# Patient Record
Sex: Male | Born: 1985 | Race: White | Hispanic: No | Smoking: Current every day smoker
Health system: Southern US, Community
[De-identification: ages and names within clinical notes are randomized; demographics above are authoritative.]

## PROBLEM LIST (undated history)

## (undated) DIAGNOSIS — Z21 Asymptomatic human immunodeficiency virus [HIV] infection status: Secondary | ICD-10-CM

## (undated) HISTORY — PX: HEMORROIDECTOMY: SUR656

## (undated) HISTORY — PX: TONSILLECTOMY: SUR1361

---

## 2008-09-28 ENCOUNTER — Emergency Department (HOSPITAL_COMMUNITY): Admission: EM | Admit: 2008-09-28 | Discharge: 2008-09-28 | Payer: Self-pay | Admitting: Family Medicine

## 2008-11-25 ENCOUNTER — Emergency Department (HOSPITAL_COMMUNITY): Admission: EM | Admit: 2008-11-25 | Discharge: 2008-11-25 | Payer: Self-pay | Admitting: Emergency Medicine

## 2010-04-25 LAB — POCT RAPID STREP A (OFFICE): Streptococcus, Group A Screen (Direct): NEGATIVE

## 2012-12-07 DIAGNOSIS — Z0289 Encounter for other administrative examinations: Secondary | ICD-10-CM | POA: Insufficient documentation

## 2013-10-02 ENCOUNTER — Emergency Department (HOSPITAL_COMMUNITY): Payer: Non-veteran care

## 2013-10-02 ENCOUNTER — Emergency Department (HOSPITAL_COMMUNITY)
Admission: EM | Admit: 2013-10-02 | Discharge: 2013-10-03 | Disposition: A | Discharge status: Other Acute Inpatient Hospital | Payer: Non-veteran care | Attending: Emergency Medicine | Admitting: Emergency Medicine

## 2013-10-02 ENCOUNTER — Encounter (HOSPITAL_COMMUNITY): Payer: Self-pay | Admitting: Emergency Medicine

## 2013-10-02 DIAGNOSIS — R000 Tachycardia, unspecified: Secondary | ICD-10-CM | POA: Insufficient documentation

## 2013-10-02 DIAGNOSIS — K610 Anal abscess: Secondary | ICD-10-CM

## 2013-10-02 DIAGNOSIS — R112 Nausea with vomiting, unspecified: Secondary | ICD-10-CM | POA: Insufficient documentation

## 2013-10-02 DIAGNOSIS — Z9089 Acquired absence of other organs: Secondary | ICD-10-CM | POA: Insufficient documentation

## 2013-10-02 DIAGNOSIS — Z79899 Other long term (current) drug therapy: Secondary | ICD-10-CM | POA: Insufficient documentation

## 2013-10-02 DIAGNOSIS — R1032 Left lower quadrant pain: Secondary | ICD-10-CM | POA: Insufficient documentation

## 2013-10-02 DIAGNOSIS — K625 Hemorrhage of anus and rectum: Secondary | ICD-10-CM | POA: Diagnosis present

## 2013-10-02 DIAGNOSIS — F17200 Nicotine dependence, unspecified, uncomplicated: Secondary | ICD-10-CM | POA: Diagnosis not present

## 2013-10-02 DIAGNOSIS — K612 Anorectal abscess: Secondary | ICD-10-CM | POA: Insufficient documentation

## 2013-10-02 LAB — BASIC METABOLIC PANEL
ANION GAP: 13 (ref 5–15)
BUN: 10 mg/dL (ref 6–23)
CALCIUM: 9.2 mg/dL (ref 8.4–10.5)
CO2: 27 meq/L (ref 19–32)
CREATININE: 0.61 mg/dL (ref 0.50–1.35)
Chloride: 94 mEq/L — ABNORMAL LOW (ref 96–112)
GFR calc Af Amer: >90 mL/min (ref >90)
GFR calc non Af Amer: >90 mL/min (ref >90)
Glucose, Bld: 62 mg/dL — ABNORMAL LOW (ref 70–99)
Potassium: 4.4 mEq/L (ref 3.7–5.3)
Sodium: 134 mEq/L — ABNORMAL LOW (ref 137–147)

## 2013-10-02 LAB — CBC WITH DIFFERENTIAL/PLATELET
Basophils Absolute: 0 10*3/uL (ref 0.0–0.1)
Basophils Relative: 0 % (ref 0–1)
EOS PCT: 1 % (ref 0–5)
Eosinophils Absolute: 0.2 10*3/uL (ref 0.0–0.7)
HCT: 45.1 % (ref 39.0–52.0)
Hemoglobin: 15.8 g/dL (ref 13.0–17.0)
LYMPHS ABS: 1.5 10*3/uL (ref 0.7–4.0)
Lymphocytes Relative: 7 % — ABNORMAL LOW (ref 12–46)
MCH: 31.2 pg (ref 26.0–34.0)
MCHC: 35 g/dL (ref 30.0–36.0)
MCV: 89.1 fL (ref 78.0–100.0)
MONO ABS: 2 10*3/uL — AB (ref 0.1–1.0)
Monocytes Relative: 9 % (ref 3–12)
NEUTROS ABS: 18.1 10*3/uL — AB (ref 1.7–7.7)
Neutrophils Relative %: 83 % — ABNORMAL HIGH (ref 43–77)
PLATELETS: 308 10*3/uL (ref 150–400)
RBC: 5.06 MIL/uL (ref 4.22–5.81)
RDW: 13.1 % (ref 11.5–15.5)
WBC: 21.8 10*3/uL — ABNORMAL HIGH (ref 4.0–10.5)

## 2013-10-02 LAB — LACTIC ACID, PLASMA: Lactic Acid, Venous: 1.7 mmol/L (ref 0.5–2.2)

## 2013-10-02 LAB — CBG MONITORING, ED
Glucose-Capillary: 105 mg/dL — ABNORMAL HIGH (ref 70–99)
Glucose-Capillary: 101 mg/dL — ABNORMAL HIGH (ref 70–99)

## 2013-10-02 MED ORDER — ONDANSETRON HCL 4 MG/2ML IJ SOLN
4.0000 mg | Freq: Once | INTRAMUSCULAR | Status: AC
  Administered 2013-10-02: 4 mg via INTRAVENOUS
  Filled 2013-10-02: qty 2

## 2013-10-02 MED ORDER — IOHEXOL 300 MG/ML  SOLN
25.0000 mL | INTRAMUSCULAR | Status: DC | PRN
  Administered 2013-10-02: 25 mL via ORAL

## 2013-10-02 MED ORDER — HYDROMORPHONE HCL PF 1 MG/ML IJ SOLN
1.0000 mg | Freq: Once | INTRAMUSCULAR | Status: AC
  Administered 2013-10-02: 1 mg via INTRAVENOUS
  Filled 2013-10-02: qty 1

## 2013-10-02 MED ORDER — GLUCOSE 40 % PO GEL
1.0000 | Freq: Once | ORAL | Status: AC
  Administered 2013-10-02: 37.5 g via ORAL
  Filled 2013-10-02: qty 1

## 2013-10-02 MED ORDER — ACETAMINOPHEN 325 MG PO TABS
650.0000 mg | ORAL_TABLET | Freq: Once | ORAL | Status: AC
  Administered 2013-10-02: 650 mg via ORAL
  Filled 2013-10-02: qty 2

## 2013-10-02 MED ORDER — SODIUM CHLORIDE 0.9 % IV BOLUS (SEPSIS)
1000.0000 mL | Freq: Once | INTRAVENOUS | Status: AC
  Administered 2013-10-02: 1000 mL via INTRAVENOUS

## 2013-10-02 MED ORDER — SODIUM CHLORIDE 0.9 % IV SOLN
INTRAVENOUS | Status: DC
  Administered 2013-10-02: 125 mL/h via INTRAVENOUS

## 2013-10-02 MED ORDER — CLINDAMYCIN PHOSPHATE 600 MG/50ML IV SOLN
600.0000 mg | Freq: Once | INTRAVENOUS | Status: AC
  Administered 2013-10-02: 600 mg via INTRAVENOUS
  Filled 2013-10-02: qty 50

## 2013-10-02 MED ORDER — ONDANSETRON HCL 4 MG/2ML IJ SOLN
4.0000 mg | Freq: Once | INTRAMUSCULAR | Status: AC
Start: 2013-10-02 — End: 2013-10-02
  Administered 2013-10-02: 4 mg via INTRAVENOUS
  Filled 2013-10-02: qty 2

## 2013-10-02 MED ORDER — IOHEXOL 300 MG/ML  SOLN
80.0000 mL | Freq: Once | INTRAMUSCULAR | Status: AC | PRN
  Administered 2013-10-02: 80 mL via INTRAVENOUS

## 2013-10-02 NOTE — ED Notes (Signed)
Tiffany, PA-C at the bedside.

## 2013-10-02 NOTE — ED Notes (Signed)
cbg is 105

## 2013-10-02 NOTE — ED Notes (Signed)
Tiffany, PA-C, gives verbal order for  zofran IV

## 2013-10-02 NOTE — ED Notes (Signed)
Spoke with Elmarie Shiley, PA-C with patient status.  Temp is 100.0.  She acknowledges, no new orders.  Considering surgery, patient is allowed ice chips at this time.

## 2013-10-02 NOTE — ED Notes (Signed)
Monica, Diplomatic Services operational officer has called for transport.

## 2013-10-02 NOTE — ED Provider Notes (Signed)
CSN: [id number]     Arrival date & time 10/02/13  1212 History   First MD Initiated Contact with Patient 10/02/13 1508     Chief Complaint  Patient presents with  . Rectal Bleeding  . Pain     (Consider location/radiation/quality/duration/timing/severity/associated sxs/prior Treatment) HPI  [facility] Leana Roe, DO)  Patient to the ER with complaints of rectal pain, swelling. As well as fever, nausea and vomiting since this morning. He had a hemmroidectomy done on September 3 (10 days ago) and approx 5 days ago started to develop increased swelling and  increased pain. As of yesterday he has started having too much pain to pass a bowel movement. He has also noted drainage and a foul smell.  Denies sticking anything into rectum. This morning he feels sick, has nausea and vomiting as well as fever of 101. [facility] and they told him to go the local ER for evaluation.   History reviewed. No pertinent past medical history. Past Surgical History  Procedure Laterality Date  . Tonsillectomy    . Hemorroidectomy     No family history on file. History  Substance Use Topics  . Smoking status: Current Every Day Smoker  . Smokeless tobacco: Not on file  . Alcohol Use: No    Review of Systems  Gen: no weight loss, fevers, chills, night sweats  Eyes: no occular draining, occular pain,  No visual changes  Nose: no epistaxis or rhinorrhea  Mouth: no dental pain, no sore throat  Neck: no neck pain  Lungs: No hemoptysis. No wheezing or coughing CV:  No palpitations, dependent edema or orthopnea. No chest pain Abd: no diarrhea. No nausea or vomiting, + abdominal pain and rectal pain GU: no dysuria or gross hematuria  MSK:  No muscle weakness, No muscular pain Neuro: no headache, no focal neurologic deficits  Skin: no rash , no wounds Psyche: no complaints of depression or anxiety    Allergies  Review of patient's allergies indicates  no known allergies.  Home Medications   Prior to Admission medications   Medication Sig Start Date End Date Taking? Authorizing Provider  gabapentin (NEURONTIN) 100 MG capsule Take 200 mg by mouth 3 (three) times daily as needed (anxiety).   Yes Historical Provider, MD  HYDROmorphone (DILAUDID) 4 MG tablet Take 4 mg by mouth every 4 (four) hours as needed for severe pain.   Yes Historical Provider, MD  ibuprofen (ADVIL,MOTRIN) 800 MG tablet Take 800 mg by mouth every 8 (eight) hours as needed (pain).   Yes Historical Provider, MD  ondansetron (ZOFRAN) 8 MG tablet Take 4 mg by mouth every 8 (eight) hours as needed for nausea or vomiting.   Yes Historical Provider, MD  oxyCODONE-acetaminophen (PERCOCET/ROXICET) 5-325 MG per tablet Take 1-2 tablets by mouth every 6 (six) hours as needed for severe pain.   Yes Historical Provider, MD   BP 110/74  Pulse 60  Temp(Src) 98.9 F (37.2 C) (Oral)  Resp 20  SpO2 95% Physical Exam  Nursing note and vitals reviewed. Constitutional: He appears well-developed and well-nourished. No distress.  HENT:  Head: Normocephalic and atraumatic.  Eyes: Pupils are equal, round, and reactive to light.  Neck: Normal range of motion. Neck supple.  Cardiovascular: Regular rhythm.  Tachycardia present.   Pulmonary/Chest: Effort normal.  Abdominal: Soft. Bowel sounds are normal. He exhibits no distension and no fluid wave. There is tenderness (suprapubic and LLQ pain). There  is no rebound, no guarding and no CVA tenderness.  Genitourinary: Rectal exam shows tenderness.  Pt has induration, purulent foul drainage and severe tenderness. Unable to tolerate digital rectal exam due to severe pain. No bleeding.  Neurological: He is alert.  Skin: Skin is warm and dry.    ED Course  Procedures (including critical care time) Labs Review Labs Reviewed  CBC WITH DIFFERENTIAL - Abnormal; Notable for the following:    WBC 21.8 (*)    Neutrophils Relative % 83 (*)     Lymphocytes Relative 7 (*)    Neutro Abs 18.1 (*)    Monocytes Absolute 2.0 (*)    All other components within normal limits  BASIC METABOLIC PANEL - Abnormal; Notable for the following:    Sodium 134 (*)    Chloride 94 (*)    Glucose, Bld 62 (*)    All other components within normal limits  CBG MONITORING, ED - Abnormal; Notable for the following:    Glucose-Capillary 105 (*)    All other components within normal limits  CBG MONITORING, ED - Abnormal; Notable for the following:    Glucose-Capillary 101 (*)    All other components within normal limits  CULTURE, BLOOD (ROUTINE X 2)  CULTURE, BLOOD (ROUTINE X 2)  LACTIC ACID, PLASMA    Imaging Review Ct Abdomen Pelvis W Contrast  10/02/2013   CLINICAL DATA:  Recent hemorrhoid surgery.  Pain.  EXAM: CT ABDOMEN AND PELVIS WITH CONTRAST  TECHNIQUE: Multidetector CT imaging of the abdomen and pelvis was performed using the standard protocol following bolus administration of intravenous contrast.  CONTRAST:  80mL OMNIPAQUE IOHEXOL 300 MG/ML  SOLN  COMPARISON:  None.  FINDINGS: The lung bases are clear.  No pleural or pericardial effusion.  There is no suspicious liver abnormality. The gallbladder appears normal. No biliary dilatation. Normal appearance of the pancreas. The spleen is unremarkable.  The adrenal glands are both normal. The kidneys are both negative. Urinary bladder appears normal. Prostate gland and seminal vesicles are unremarkable.  Normal caliber of the abdominal aorta. There is no aneurysm. No retroperitoneal adenopathy. No small bowel mesenteric adenopathy. No pelvic or inguinal adenopathy identified.  The stomach is normal. The small bowel loops have a normal course and caliber. No obstruction. The appendix is visualized and appears normal. Normal appearance of the colon.  Within the left gluteal fold there is a perirectal/anal fluid collection measuring 2.9 x 2.2 x 4.0 cm, image 86 of series 301 and image 71 of series 303.  Review  of the visualized osseous structures is on unremarkable.  IMPRESSION: 1. Examination is positive for perianal/rectal fluid collection within the left gluteal fold. Differential considerations include abscess, hematoma, or postoperative seroma. This measures 2.9 x 2.2 x 4.0 cm and likely accounts for the patient's worsening pain and palpable abnormality.   Electronically Signed   By: Signa Kell M.D.   On: 10/02/2013 18:52     EKG Interpretation None      MDM   Final diagnoses:  Perianal abscess   3:36 pm Pt has abdominal pain, recent history, foul smelling puss coming from rectum. Unable to tolerate rectal exam to feel for fluctuance. He does not appear sick but is tachycardic and reports fever at home. Will get labs and CT scan. Will also give fluids and pain medications.  8:43 pm Patient with fever, elevated WBC, low glucose. He was given oral glucagon and fluids. PO Tylenol for low grade fever. Negative lactate. CT scan shows  large perirectal abscess. I spoke with Dr. Magnus Ivan who said pt needs to go back to the [facility] for this. I spoke with [facility] who says that I need to fill out a transfer request form, there hospitalist will review this and accept or decline patient.  10:15 pm [facility] with [facility] who did not realize this is a post surgical complication. He will contact the General Surgeon for me and will have the surgeon call me.   10: 33 pm Dr/ Claudette Laws,, the [facility] and said that they accept the patient. He needs to call the hospital administrator to find out how to transfer patient over the [place]. Pt Accepted and ready for transfer.  Filed Vitals:   10/02/13 2215  BP: 110/74  Pulse: 60  Temp:   Resp:     11:42 pm Patient seen by Dr. Littie Deeds just prior to transfer. No concerns, agrees to plan.   Dorthula Matas, PA-C 10/02/13 2337  Dorthula Matas, PA-C 10/02/13 2343

## 2013-10-02 NOTE — ED Notes (Signed)
[facility] [place] per discussion with Tiffany, PA-C.

## 2013-10-02 NOTE — ED Notes (Signed)
Pt. Stated, I had hemmoroid surgery and the pain is worse and I fell like there's a lump down there.  Called the [facility] hospital and they told me to come here.

## 2013-10-03 DIAGNOSIS — R1032 Left lower quadrant pain: Secondary | ICD-10-CM | POA: Diagnosis not present

## 2013-10-03 DIAGNOSIS — F17200 Nicotine dependence, unspecified, uncomplicated: Secondary | ICD-10-CM | POA: Diagnosis not present

## 2013-10-03 DIAGNOSIS — R112 Nausea with vomiting, unspecified: Secondary | ICD-10-CM | POA: Diagnosis not present

## 2013-10-03 DIAGNOSIS — K612 Anorectal abscess: Secondary | ICD-10-CM | POA: Diagnosis not present

## 2013-10-03 DIAGNOSIS — Z9089 Acquired absence of other organs: Secondary | ICD-10-CM | POA: Diagnosis not present

## 2013-10-03 DIAGNOSIS — K625 Hemorrhage of anus and rectum: Secondary | ICD-10-CM | POA: Diagnosis present

## 2013-10-03 DIAGNOSIS — Z79899 Other long term (current) drug therapy: Secondary | ICD-10-CM | POA: Diagnosis not present

## 2013-10-03 DIAGNOSIS — R000 Tachycardia, unspecified: Secondary | ICD-10-CM | POA: Diagnosis not present

## 2013-10-03 MED ORDER — HYDROMORPHONE HCL PF 1 MG/ML IJ SOLN
1.0000 mg | Freq: Once | INTRAMUSCULAR | Status: AC
  Administered 2013-10-03: 1 mg via INTRAVENOUS
  Filled 2013-10-03: qty 1

## 2013-10-03 MED ORDER — PROMETHAZINE HCL 25 MG/ML IJ SOLN
12.5000 mg | Freq: Once | INTRAMUSCULAR | Status: AC
  Administered 2013-10-03: 12.5 mg via INTRAVENOUS
  Filled 2013-10-03: qty 1

## 2013-10-03 NOTE — ED Notes (Signed)
Visual merchandiser, Maxine Glenn, for ETA on transport.  No ETA, transport is behind.

## 2013-10-03 NOTE — ED Provider Notes (Signed)
Medical screening examination/treatment/procedure(s) were conducted as a shared visit with non-physician practitioner(s) and myself.  I personally evaluated the patient during the encounter.   EKG Interpretation None      Briefly, pt is a 28 y.o. male presenting with rectal bleeding and pus from rectum in setting of recent hemorrhoidectomy.  Discussed with surgery and pt transferred to [place].  I performed an examination on the patient including cardiac, pulmonary, and gi systems which were unremarkable.     Mirian Mo, MD 10/03/13 [phone]

## 2013-10-08 LAB — CULTURE, BLOOD (ROUTINE X 2)
CULTURE: NO GROWTH
Culture: NO GROWTH

## 2016-01-13 IMAGING — CT CT ABD-PELV W/ CM
2 of 5 series · 12 of 46 positions shown, 14 images · IV contrast (Iodine)
Comparison: None.

CLINICAL DATA: Recent hemorrhoid surgery.  Pain.

EXAM:
CT ABDOMEN AND PELVIS WITH CONTRAST
TECHNIQUE: Multidetector CT imaging of the abdomen and pelvis was performed
using the standard protocol following bolus administration of
intravenous contrast.
CONTRAST:  80mL OMNIPAQUE IOHEXOL 300 MG/ML  SOLN

[Series 301: routine, idose (2) · axial · 0.78mm/px · z∈[-487,-87]mm · 9 of 92 slices shown, 11 images]
[im 6/92  soft-tissue]
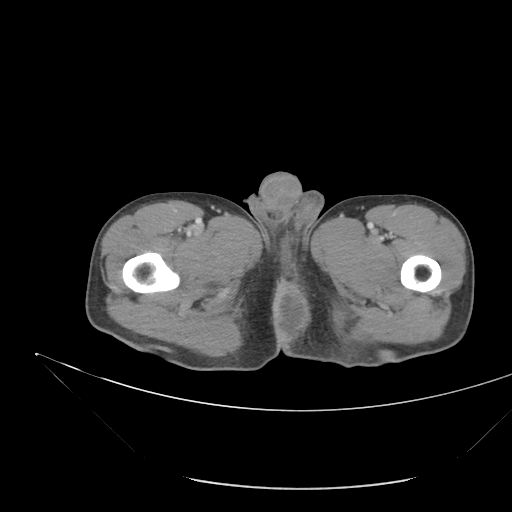
[im 6/92  bone]
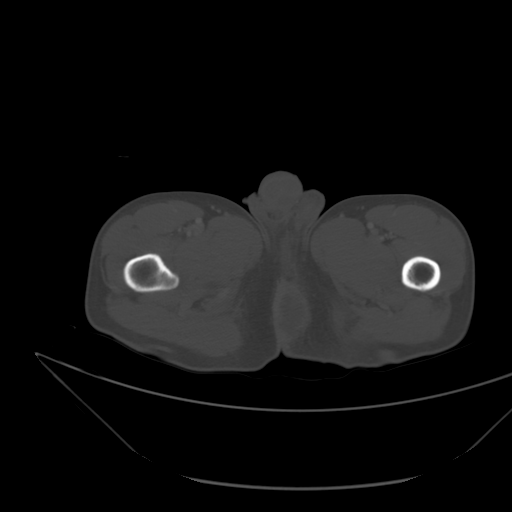
[im 18/92  soft-tissue]
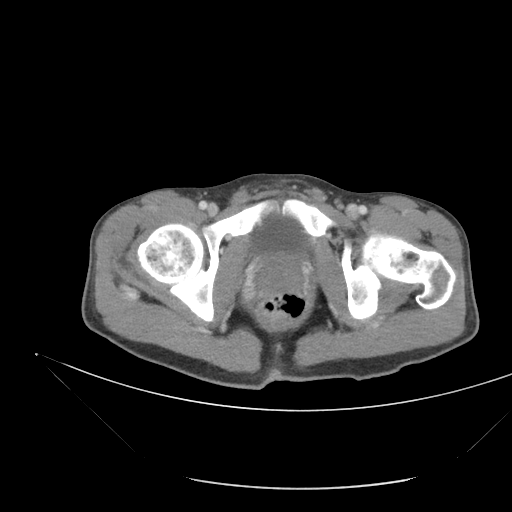
[im 27/92  soft-tissue]
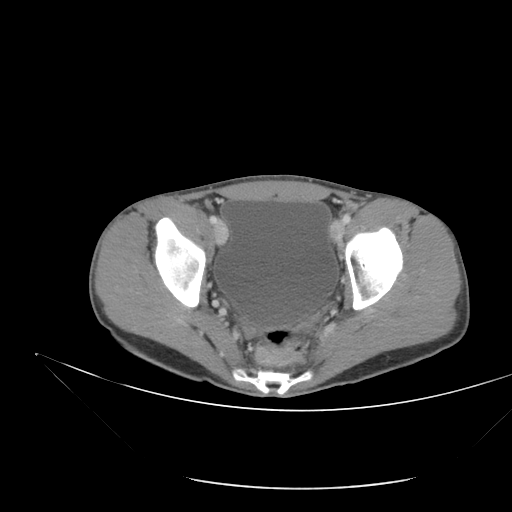
[im 36/92  soft-tissue]
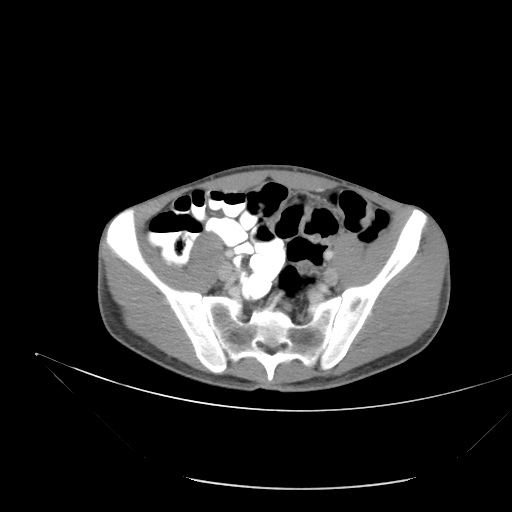
[im 47/92  soft-tissue]
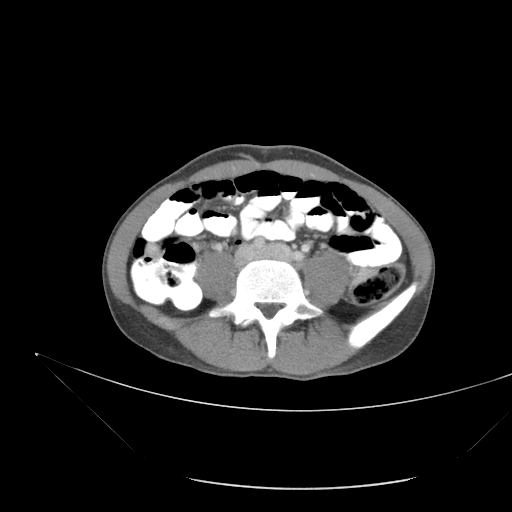
[im 56/92  soft-tissue]
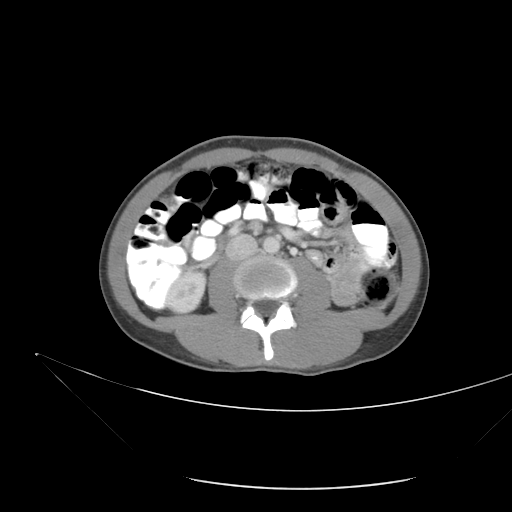
[im 65/92  soft-tissue]
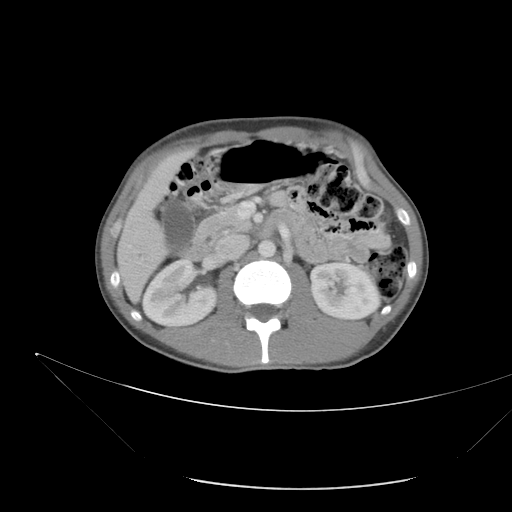
[im 77/92  soft-tissue]
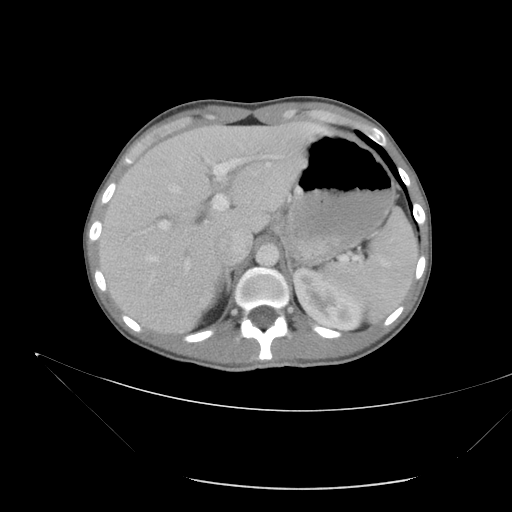
[im 86/92  soft-tissue]
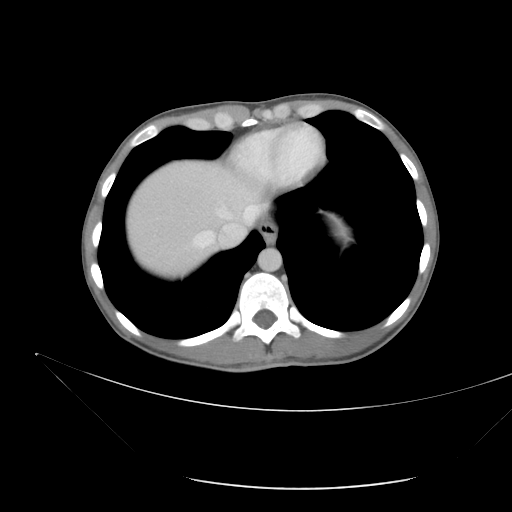
[im 86/92  bone]
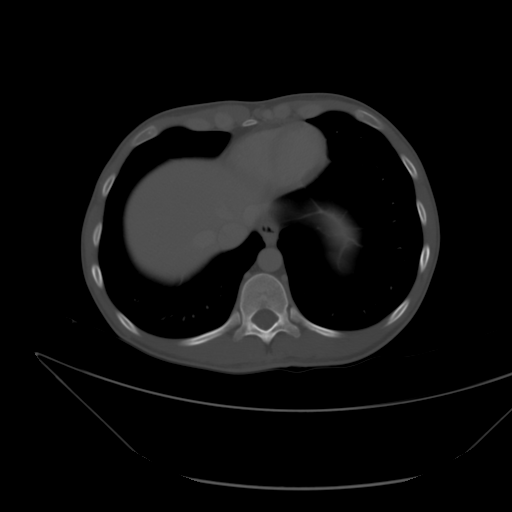

[Series 303: coronals, idose (2) · coronal · 0.50mm/px · 3 of 123 slices shown]
[im 46/123  soft-tissue]
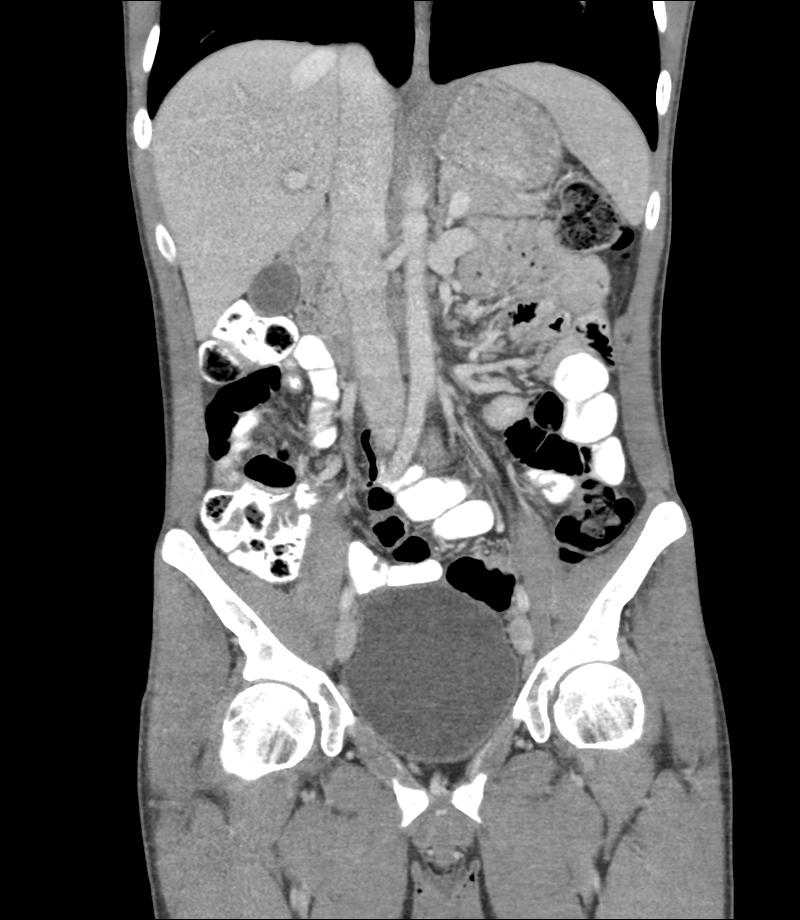
[im 62/123  soft-tissue]
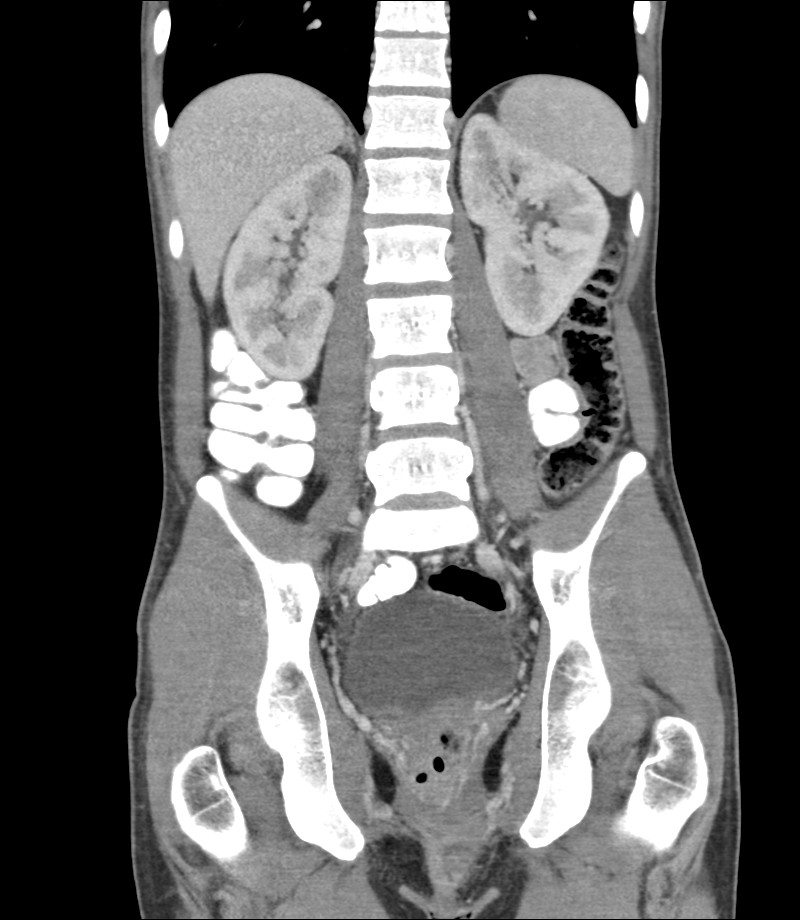
[im 77/123  soft-tissue]
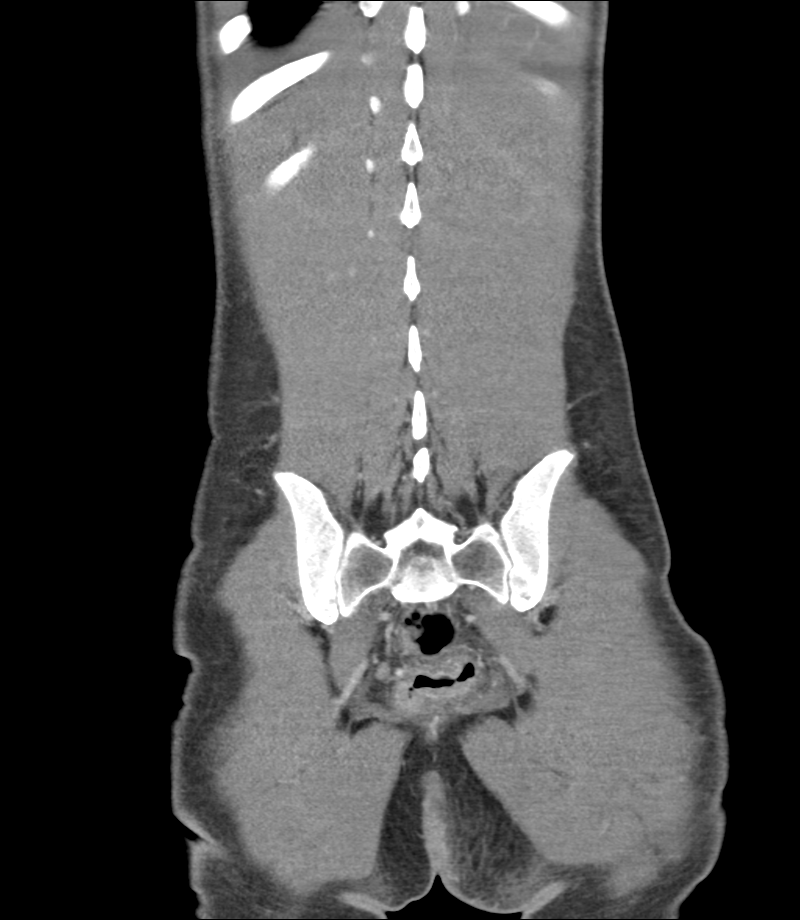

[12 of 46 positions shown; findings below may reference images not displayed]

FINDINGS: The lung bases are clear.  No pleural or pericardial effusion.

There is no suspicious liver abnormality. The gallbladder appears
normal. No biliary dilatation. Normal appearance of the pancreas.
The spleen is unremarkable.

The adrenal glands are both normal. The kidneys are both negative.
Urinary bladder appears normal. Prostate gland and seminal vesicles
are unremarkable.

Normal caliber of the abdominal aorta. There is no aneurysm. No
retroperitoneal adenopathy. No small bowel mesenteric adenopathy. No
pelvic or inguinal adenopathy identified.

The stomach is normal. The small bowel loops have a normal course
and caliber. No obstruction. The appendix is visualized and appears
normal. Normal appearance of the colon.

Within the left gluteal fold there is a perirectal/anal fluid
collection measuring 2.9 x 2.2 x 4.0 cm, image 86 of series 301 and
image 71 of series 303.

Review of the visualized osseous structures is on unremarkable.
IMPRESSION: 1. Examination is positive for perianal/rectal fluid collection
within the left gluteal fold. Differential considerations include
abscess, hematoma, or postoperative seroma. This measures 2.9 x
x 4.0 cm and likely accounts for the patient's worsening pain and
palpable abnormality.

## 2018-11-17 ENCOUNTER — Encounter (HOSPITAL_COMMUNITY): Payer: Self-pay

## 2018-11-17 ENCOUNTER — Ambulatory Visit (HOSPITAL_COMMUNITY)
Admission: EM | Admit: 2018-11-17 | Discharge: 2018-11-17 | Disposition: A | Discharge status: Home / Self Care | Payer: No Typology Code available for payment source | Attending: Emergency Medicine | Admitting: Emergency Medicine

## 2018-11-17 DIAGNOSIS — R0982 Postnasal drip: Secondary | ICD-10-CM

## 2018-11-17 DIAGNOSIS — M791 Myalgia, unspecified site: Secondary | ICD-10-CM

## 2018-11-17 DIAGNOSIS — Z20828 Contact with and (suspected) exposure to other viral communicable diseases: Secondary | ICD-10-CM | POA: Insufficient documentation

## 2018-11-17 DIAGNOSIS — R0981 Nasal congestion: Secondary | ICD-10-CM

## 2018-11-17 DIAGNOSIS — R6883 Chills (without fever): Secondary | ICD-10-CM

## 2018-11-17 DIAGNOSIS — B349 Viral infection, unspecified: Secondary | ICD-10-CM | POA: Insufficient documentation

## 2018-11-17 DIAGNOSIS — R05 Cough: Secondary | ICD-10-CM

## 2018-11-17 DIAGNOSIS — R519 Headache, unspecified: Secondary | ICD-10-CM

## 2018-11-17 DIAGNOSIS — R197 Diarrhea, unspecified: Secondary | ICD-10-CM

## 2018-11-17 DIAGNOSIS — J3489 Other specified disorders of nose and nasal sinuses: Secondary | ICD-10-CM

## 2018-11-17 DIAGNOSIS — Z20822 Contact with and (suspected) exposure to covid-19: Secondary | ICD-10-CM

## 2018-11-17 DIAGNOSIS — J029 Acute pharyngitis, unspecified: Secondary | ICD-10-CM

## 2018-11-17 MED ORDER — IBUPROFEN 600 MG PO TABS: 600.0000 mg | ORAL_TABLET | Freq: Four times a day (QID) | ORAL | 0 refills | Status: AC | PRN

## 2018-11-17 MED ORDER — ONDANSETRON 8 MG PO TBDP: ORAL_TABLET | ORAL | 0 refills | Status: DC

## 2018-11-17 MED ORDER — FLUTICASONE PROPIONATE 50 MCG/ACT NA SUSP: 2.0000 | Freq: Every day | NASAL | 0 refills | Status: DC

## 2018-11-17 NOTE — ED Triage Notes (Signed)
Pt states he is having diarrhea, cough, body aches and headache x 4 days. Pt. report 4 of his coworker were sick and tested negative for Covid.

## 2018-11-17 NOTE — Discharge Instructions (Addendum)
Flonase, saline nasal irrigation with a Milta Deiters med rinse and distilled water as often as you want to help control nasal congestion and postnasal drip and to prevent a sinus infection.  600 mg of ibuprofen combined with 1 g of Tylenol 3-4 times a day as needed for body aches, headaches, fevers and chills.  5 cc of Benadryl mixed with 5 cc of Maalox together 3 or 4 times a day for sore throat.  Hold in mouth, gargle, swallow.  Zofran for the nausea.  It will also cause constipation.  Follow-up with our CIG as soon as you possibly can.

## 2018-11-17 NOTE — ED Provider Notes (Signed)
HPI  SUBJECTIVE:  Garrett Gamble is a 33 y.o. male who presents with 5 days of sore throat, nonproductive cough, 3 or 4 episodes of watery, nonbloody diarrhea per day.  He reports chills, nasal congestion, clear rhinorrhea, postnasal drip, sore throat, body aches, headaches, nausea.  No fevers, loss of sense of smell or taste, wheezing, chest pain, shortness of breath.  No abdominal pain, vomiting.  He has had 4 workers with identical symptoms, thinks that they all tested negative for Covid, but is not sure.  No voice changes, sensation of throat swelling shut, difficulty breathing, drooling, trismus, neck stiffness, rash.  States he is unable to sleep at night secondary to the cough.  He tried rest, pushing fluids without improvement in symptoms.  He states his symptoms are worse in the morning.  He has a past medical history of HIV that was diagnosed in early June by the health department, however is not receiving any medical treatment for it currently.  Tried seeking treatment at the [place] without success.  He said he has had multiple positive HIV tests, unsure what his CD4 count is, thinks that his viral load was around 1381 1 month ago.  He denies IV drug use, blood transfusion.  He is in a monogamous relationship with a male partner of 5 years.  No history of asthma, diabetes, hypertension, chronic kidney disease, smoking.  PMD: the [place].  History reviewed. No pertinent past medical history.  Past Surgical History:  Procedure Laterality Date  . HEMORROIDECTOMY    . TONSILLECTOMY      No family history on file.  Social History   Tobacco Use  . Smoking status: Current Every Day Smoker  . Smokeless tobacco: Never Used  Substance Use Topics  . Alcohol use: No  . Drug use: No    No current facility-administered medications for this encounter.   Current Outpatient Medications:  .  buPROPion (WELLBUTRIN XL) 150 MG 24 hr tablet, Take 150 mg by mouth daily., Disp: , Rfl:  .  escitalopram  (LEXAPRO) 5 MG tablet, Take 5 mg by mouth daily., Disp: , Rfl:  .  topiramate (TOPAMAX) 15 MG capsule, Take 15 mg by mouth 2 (two) times daily., Disp: , Rfl:  .  fluticasone (FLONASE) 50 MCG/ACT nasal spray, Place 2 sprays into both nostrils daily., Disp: 16 g, Rfl: 0 .  gabapentin (NEURONTIN) 100 MG capsule, Take 200 mg by mouth 3 (three) times daily as needed (anxiety)., Disp: , Rfl:  .  ibuprofen (ADVIL) 600 MG tablet, Take 1 tablet (600 mg total) by mouth every 6 (six) hours as needed., Disp: 30 tablet, Rfl: 0 .  ondansetron (ZOFRAN ODT) 8 MG disintegrating tablet, 1/2- 1 tablet q 8 hr prn nausea, vomiting, Disp: 20 tablet, Rfl: 0  No Known Allergies   ROS  As noted in HPI.   Physical Exam  BP 122/89 (BP Location: Left Arm)   Pulse 72   Temp 98.7 F (37.1 C) (Oral)   Resp 17   SpO2 100%   Constitutional: Well developed, well nourished, no acute distress Eyes: PERRL, EOMI, conjunctiva normal bilaterally HENT: Normocephalic, atraumatic,mucus membranes moist.  Clear rhinorrhea.  Erythematous, but not swollen turbinates.  No maxillary, frontal sinus tenderness.  Tonsils surgically absent.  Slightly erythematous oropharynx.  Positive postnasal drip. Neck: No cervical lymphadenopathy, meningismus. Respiratory: Clear to auscultation bilaterally, no rales, no wheezing, no rhonchi Cardiovascular: Normal rate and rhythm, no murmurs, no gallops, no rubs GI: Soft, nondistended, normal bowel sounds, nontender,  no rebound, no guarding.  No splenomegaly. Back: no CVAT skin: No rash, skin intact Musculoskeletal: no deformities Neurologic: Alert & oriented x 3, CN III-XII grossly intact, no motor deficits, sensation grossly intact Psychiatric: Speech and behavior appropriate   ED Course   Medications - No data to display  Orders Placed This Encounter  Procedures  . Novel Coronavirus, NAA (Hosp order, Send-out to Ref Lab; TAT 18-24 hrs    Standing Status:   Standing    Number of  Occurrences:   1    Order Specific Question:   Is this test for diagnosis or screening    Answer:   Screening    Order Specific Question:   Symptomatic for COVID-19 as defined by CDC    Answer:   No    Order Specific Question:   Hospitalized for COVID-19    Answer:   No    Order Specific Question:   Admitted to ICU for COVID-19    Answer:   No    Order Specific Question:   Previously tested for COVID-19    Answer:   No    Order Specific Question:   Resident in a congregate (group) care setting    Answer:   No    Order Specific Question:   Employed in healthcare setting    Answer:   No   No results found for this or any previous visit (from the past 24 hour(s)). No results found.  ED Clinical Impression  1. Viral illness   2. Suspected COVID-19 virus infection      ED Assessment/Plan   Patient with a viral syndrome, could be COVID-19 versus uncontrolled HIV.  He does not appear toxic.  His abdomen is benign.  His vitals are normal.  No difficulty breathing.  Do not have any record of positive HIV testing in the [facility] system or through care everywhere, patient states that he has documented proof of being HIV positive from the health department.  He declined HIV testing today.  Will refer him to the [facility] for further management.  Covid test sent.  Home with supportive treatment including Flonase, saline nasal irrigation, Benadryl/Maalox mixture, ibuprofen 6 mg combined with 1 g of Tylenol, Zofran.  Discussed labs,  MDM, treatment plan, and plan for follow-up with patient  patient agrees with plan.   Meds ordered this encounter  Medications  . fluticasone (FLONASE) 50 MCG/ACT nasal spray    Sig: Place 2 sprays into both nostrils daily.    Dispense:  16 g    Refill:  0  . ondansetron (ZOFRAN ODT) 8 MG disintegrating tablet    Sig: 1/2- 1 tablet q 8 hr prn nausea, vomiting    Dispense:  20 tablet    Refill:  0  . ibuprofen (ADVIL) 600 MG tablet     Sig: Take 1 tablet (600 mg total) by mouth every 6 (six) hours as needed.    Dispense:  30 tablet    Refill:  0    *This clinic note was created using [employer]. Therefore, there may be occasional mistakes despite careful proofreading.  ?    Melynda Ripple, MD 11/17/18 2124

## 2018-11-20 LAB — NOVEL CORONAVIRUS, NAA (HOSP ORDER, SEND-OUT TO REF LAB; TAT 18-24 HRS): SARS-CoV-2, NAA: NOT DETECTED

## 2023-04-07 DIAGNOSIS — Z21 Asymptomatic human immunodeficiency virus [HIV] infection status: Secondary | ICD-10-CM | POA: Insufficient documentation

## 2023-11-10 ENCOUNTER — Encounter: Payer: Self-pay | Admitting: Emergency Medicine

## 2023-11-10 ENCOUNTER — Ambulatory Visit
Admission: EM | Admit: 2023-11-10 | Discharge: 2023-11-10 | Disposition: A | Discharge status: Home / Self Care | Attending: Physician Assistant | Admitting: Physician Assistant

## 2023-11-10 DIAGNOSIS — J101 Influenza due to other identified influenza virus with other respiratory manifestations: Secondary | ICD-10-CM | POA: Insufficient documentation

## 2023-11-10 DIAGNOSIS — B349 Viral infection, unspecified: Secondary | ICD-10-CM

## 2023-11-10 DIAGNOSIS — J029 Acute pharyngitis, unspecified: Secondary | ICD-10-CM | POA: Diagnosis present

## 2023-11-10 DIAGNOSIS — Z20828 Contact with and (suspected) exposure to other viral communicable diseases: Secondary | ICD-10-CM | POA: Insufficient documentation

## 2023-11-10 DIAGNOSIS — R051 Acute cough: Secondary | ICD-10-CM | POA: Insufficient documentation

## 2023-11-10 DIAGNOSIS — F17200 Nicotine dependence, unspecified, uncomplicated: Secondary | ICD-10-CM | POA: Insufficient documentation

## 2023-11-10 DIAGNOSIS — R509 Fever, unspecified: Secondary | ICD-10-CM | POA: Diagnosis not present

## 2023-11-10 DIAGNOSIS — R519 Headache, unspecified: Secondary | ICD-10-CM | POA: Diagnosis present

## 2023-11-10 DIAGNOSIS — Z21 Asymptomatic human immunodeficiency virus [HIV] infection status: Secondary | ICD-10-CM | POA: Diagnosis not present

## 2023-11-10 HISTORY — DX: Asymptomatic human immunodeficiency virus (hiv) infection status: Z21

## 2023-11-10 LAB — RESP PANEL BY RT-PCR (FLU A&B, COVID) ARPGX2
Influenza A by PCR: NEGATIVE
Influenza B by PCR: POSITIVE — AB
SARS Coronavirus 2 by RT PCR: NEGATIVE

## 2023-11-10 MED ORDER — IPRATROPIUM BROMIDE 0.06 % NA SOLN: 2.0000 | Freq: Four times a day (QID) | NASAL | 0 refills | Status: DC

## 2023-11-10 MED ORDER — OSELTAMIVIR PHOSPHATE 75 MG PO CAPS: 75.0000 mg | ORAL_CAPSULE | Freq: Two times a day (BID) | ORAL | 0 refills | Status: AC

## 2023-11-10 MED ORDER — PROMETHAZINE-DM 6.25-15 MG/5ML PO SYRP: 5.0000 mL | ORAL_SOLUTION | Freq: Four times a day (QID) | ORAL | 0 refills | Status: DC | PRN

## 2023-11-10 NOTE — ED Provider Notes (Signed)
 [facility]    CSN: [id number] Arrival date & time: 11/10/23  1453      History   Chief Complaint Chief Complaint  Patient presents with   Nasal Congestion   Cough   Headache   Emesis   Generalized Body Aches   Fever   Chills    HPI Garrett Gamble is a 38 y.o. male presenting for fever up to 101 degrees, fatigue, cough, headaches, chills congestion, sore throat and body aches x 2 days.  Denies ear pain, sinus pain, chest pain, wheezing, shortness of breath, abdominal pain, or diarrhea.  Patient has been exposed to flu B.  Patient has been taking over-the-counter Aleve. No other complaints.  HPI  Past Medical History:  Diagnosis Date   HIV (human immunodeficiency virus infection) (HCC)     There are no active problems to display for this patient.   Past Surgical History:  Procedure Laterality Date   HEMORROIDECTOMY     TONSILLECTOMY         Home Medications    Prior to Admission medications   Medication Sig Start Date End Date Taking? Authorizing Provider  gabapentin (NEURONTIN) 100 MG capsule Take 200 mg by mouth 3 (three) times daily as needed (anxiety).   Yes [provider]  ibuprofen  (ADVIL ) 600 MG tablet Take 1 tablet (600 mg total) by mouth every 6 (six) hours as needed. 11/17/18  Yes Mortenson, Ashley, MD  ipratropium (ATROVENT) 0.06 % nasal spray Place 2 sprays into both nostrils 4 (four) times daily. 11/10/23  Yes Arvis Jolan NOVAK, PA-C  oseltamivir (TAMIFLU) 75 MG capsule Take 1 capsule (75 mg total) by mouth every 12 (twelve) hours for 5 days. 11/10/23 11/15/23 Yes Arvis Jolan B, PA-C  promethazine -dextromethorphan (PROMETHAZINE -DM) 6.25-15 MG/5ML syrup Take 5 mLs by mouth 4 (four) times daily as needed. 11/10/23  Yes Arvis Jolan NOVAK, PA-C  bictegravir-emtricitabine-tenofovir AF (BIKTARVY) 50-200-25 MG TABS tablet Take 1 tablet by mouth every evening.    [provider]  buPROPion (WELLBUTRIN XL) 150 MG 24 hr tablet Take  150 mg by mouth daily.    [provider]  escitalopram (LEXAPRO) 5 MG tablet Take 5 mg by mouth daily.    [provider]  fluticasone  (FLONASE ) 50 MCG/ACT nasal spray Place 2 sprays into both nostrils daily. 11/17/18   Van Knee, MD  ondansetron  (ZOFRAN  ODT) 8 MG disintegrating tablet 1/2- 1 tablet q 8 hr prn nausea, vomiting 11/17/18   Mortenson, Ashley, MD  topiramate (TOPAMAX) 15 MG capsule Take 15 mg by mouth 2 (two) times daily.    [provider]    Family History No family history on file.  Social History Social History   Tobacco Use   Smoking status: Every Day   Smokeless tobacco: Never  Vaping Use   Vaping status: Never Used  Substance Use Topics   Alcohol use: No   Drug use: Yes    Types: Marijuana     Allergies   Patient has no known allergies.   Review of Systems Review of Systems  Constitutional:  Positive for chills and fatigue. Negative for fever.  HENT:  Positive for congestion, rhinorrhea and sore throat. Negative for sinus pressure and sinus pain.   Respiratory:  Positive for cough. Negative for shortness of breath.   Cardiovascular:  Negative for chest pain.  Gastrointestinal:  Positive for nausea and vomiting. Negative for abdominal pain and diarrhea.  Musculoskeletal:  Positive for myalgias.  Neurological:  Positive for headaches. Negative  for weakness and light-headedness.  Hematological:  Negative for adenopathy.     Physical Exam Triage Vital Signs ED Triage Vitals  Encounter Vitals Group     BP      Girls Systolic BP Percentile      Girls Diastolic BP Percentile      Boys Systolic BP Percentile      Boys Diastolic BP Percentile      Pulse      Resp      Temp      Temp src      SpO2      Weight      Height      Head Circumference      Peak Flow      Pain Score      Pain Loc      Pain Education      Exclude from Growth Chart    No data found.  Updated Vital Signs BP 135/86 (BP Location:  Right Arm)   Pulse 86   Temp 99.6 F (37.6 C) (Oral)   Resp 18   SpO2 96%      Physical Exam Vitals and nursing note reviewed.  Constitutional:      General: Garrett Gamble is not in acute distress.    Appearance: Normal appearance. Garrett Gamble is well-developed. Garrett Gamble is ill-appearing.  HENT:     Head: Normocephalic and atraumatic.     Nose: Congestion present.     Mouth/Throat:     Mouth: Mucous membranes are moist.     Pharynx: Oropharynx is clear.  Eyes:     General: No scleral icterus.    Conjunctiva/sclera: Conjunctivae normal.  Cardiovascular:     Rate and Rhythm: Normal rate and regular rhythm.  Pulmonary:     Effort: Pulmonary effort is normal. No respiratory distress.     Breath sounds: Normal breath sounds.  Musculoskeletal:     Cervical back: Neck supple.  Skin:    General: Skin is warm and dry.     Capillary Refill: Capillary refill takes less than 2 seconds.  Neurological:     General: No focal deficit present.     Mental Status: Garrett Gamble is alert. Mental status is at baseline.     Motor: No weakness.     Gait: Gait normal.  Psychiatric:        Mood and Affect: Mood normal.        Behavior: Behavior normal.      UC Treatments / Results  Labs (all labs ordered are listed, but only abnormal results are displayed) Labs Reviewed  RESP PANEL BY RT-PCR (FLU A&B, COVID) ARPGX2 - Abnormal; Notable for the following components:      Result Value   Influenza B by PCR POSITIVE (*)    All other components within normal limits    EKG   Radiology No results found.  Procedures Procedures (including critical care time)  Medications Ordered in UC Medications - No data to display  Initial Impression / Assessment and Plan / UC Course  I have reviewed the triage vital signs and the nursing notes.  Pertinent labs & imaging results that were available during my care of the patient were reviewed by me and considered in my medical decision making (see chart for details).   38 year old  male presents for fever, fatigue, cough, congestion, chills, body aches and headaches x 2 days.  Garrett Gamble has been exposed to influenza B.  Vitals are all stable and normal.  Patient in no  acute distress.  Garrett Gamble is ill-appearing and coughs a few times.  Garrett Gamble has nasal congestion.  Throat is clear.  Chest clear.  Heart regular rate and rhythm.  Respiratory panel obtained.  Positive flu B.  Results discussed with patient.  Will treat at this time with Tamiflu.  Reviewed current CDC guidelines, isolation protocol and ED precautions for flu.  I also sent Promethazine  DM and Atrovent nasal spray.  Reviewed return precautions.  Work note was given.  Acute illness with systemic symptoms.   Final Clinical Impressions(s) / UC Diagnoses   Final diagnoses:  Viral illness  Acute cough  Fever, unspecified  Exposure to the flu  Influenza B     Discharge Instructions      -Pending flu, COVID testing.  Will contact you with any positive results. - I expect your flu test will be positive.  You are within the window for treatment Tamiflu to potentially be helpful so I sent it to the pharmacy if you are positive. - Sent cough medicine. - You need to isolate until you are fever free for 24 hours and symptoms are improving. - Increase rest and fluids. - You should be seen again if you have uncontrolled fever, weakness or worsening breathing problem.     ED Prescriptions     Medication Sig Dispense Auth. Provider   promethazine -dextromethorphan (PROMETHAZINE -DM) 6.25-15 MG/5ML syrup Take 5 mLs by mouth 4 (four) times daily as needed. 118 mL Arvis Huxley B, PA-C   ipratropium (ATROVENT) 0.06 % nasal spray Place 2 sprays into both nostrils 4 (four) times daily. 15 mL Arvis Huxley B, PA-C   oseltamivir (TAMIFLU) 75 MG capsule Take 1 capsule (75 mg total) by mouth every 12 (twelve) hours for 5 days. 10 capsule Arvis Huxley NOVAK, PA-C      PDMP not reviewed this encounter.   Arvis Huxley NOVAK, PA-C 11/10/23  [phone]

## 2023-11-10 NOTE — ED Triage Notes (Signed)
 Pt presents with a cough, runny nose, fever, bodyaches, chills, and headache x 2 days. He was exposed to Flu B. He has taken Naproxen for his symptoms.

## 2023-11-10 NOTE — Discharge Instructions (Signed)
 -  Pending flu, COVID testing.  Will contact you with any positive results. - I expect your flu test will be positive.  You are within the window for treatment Tamiflu to potentially be helpful so I sent it to the pharmacy if you are positive. - Sent cough medicine. - You need to isolate until you are fever free for 24 hours and symptoms are improving. - Increase rest and fluids. - You should be seen again if you have uncontrolled fever, weakness or worsening breathing problem.

## 2023-11-11 ENCOUNTER — Ambulatory Visit (HOSPITAL_COMMUNITY): Payer: Self-pay

## 2023-11-24 NOTE — Progress Notes (Deleted)
 Psychiatric Initial Adult Assessment   Patient Identification: Garrett Gamble MRN:  [id number] Date of Evaluation:  11/24/2023 Referral Source: *** Chief Complaint:  No chief complaint on file.  Visit Diagnosis: No diagnosis found.  History of Present Illness:   Garrett Gamble is a 38 y.o. year old male with a history of depression, anxiety, HIV on Biktarvy, who is referred for depression.    Exercise: Support: Household:  Marital status: Number of children: Employment:  Education:           Associated Signs/Symptoms: Depression Symptoms:  {DEPRESSION SYMPTOMS:20000} (Hypo) Manic Symptoms:  {BHH MANIC SYMPTOMS:22872} Anxiety Symptoms:  {BHH ANXIETY SYMPTOMS:22873} Psychotic Symptoms:  {BHH PSYCHOTIC SYMPTOMS:22874} PTSD Symptoms: {BHH PTSD SYMPTOMS:22875}  Past Psychiatric History:  Outpatient:  Psychiatry admission:  Previous suicide attempt:  Past trials of medication:  History of violence:  History of head injury:   Previous Psychotropic Medications: {YES/NO:21197}  Substance Abuse History in the last 12 months:  {yes no:314532}  Consequences of Substance Abuse: {BHH CONSEQUENCES OF SUBSTANCE ABUSE:22880}  Past Medical History:  Past Medical History:  Diagnosis Date   HIV (human immunodeficiency virus infection) (HCC)     Past Surgical History:  Procedure Laterality Date   HEMORROIDECTOMY     TONSILLECTOMY      Family Psychiatric History: ***  Family History: No family history on file.  Social History:   Social History   Socioeconomic History   Marital status: Single    Spouse name: Not on file   Number of children: Not on file   Years of education: Not on file   Highest education level: Not on file  Occupational History   Not on file  Tobacco Use   Smoking status: Every Day   Smokeless tobacco: Never  Vaping Use   Vaping status: Never Used  Substance and Sexual Activity   Alcohol use: No   Drug use: Yes    Types: Marijuana   Sexual  activity: Not on file  Other Topics Concern   Not on file  Social History Narrative   Not on file   Social Drivers of Health   Financial Resource Strain: Not on file  Food Insecurity: Not on file  Transportation Needs: Not on file  Physical Activity: Not on file  Stress: Not on file  Social Connections: Not on file    Additional Social History: ***  Allergies:  No Known Allergies  Metabolic Disorder Labs: No results found for: HGBA1C, MPG No results found for: PROLACTIN No results found for: CHOL, TRIG, HDL, CHOLHDL, VLDL, LDLCALC No results found for: TSH  Therapeutic Level Labs: No results found for: LITHIUM No results found for: CBMZ No results found for: VALPROATE  Current Medications: Current Outpatient Medications  Medication Sig Dispense Refill   bictegravir-emtricitabine-tenofovir AF (BIKTARVY) 50-200-25 MG TABS tablet Take 1 tablet by mouth every evening.     buPROPion (WELLBUTRIN XL) 150 MG 24 hr tablet Take 150 mg by mouth daily.     escitalopram (LEXAPRO) 5 MG tablet Take 5 mg by mouth daily.     fluticasone  (FLONASE ) 50 MCG/ACT nasal spray Place 2 sprays into both nostrils daily. 16 g 0   gabapentin (NEURONTIN) 100 MG capsule Take 200 mg by mouth 3 (three) times daily as needed (anxiety).     ibuprofen  (ADVIL ) 600 MG tablet Take 1 tablet (600 mg total) by mouth every 6 (six) hours as needed. 30 tablet 0   ipratropium (ATROVENT) 0.06 % nasal spray Place 2 sprays into both nostrils  4 (four) times daily. 15 mL 0   ondansetron  (ZOFRAN  ODT) 8 MG disintegrating tablet 1/2- 1 tablet q 8 hr prn nausea, vomiting 20 tablet 0   promethazine -dextromethorphan (PROMETHAZINE -DM) 6.25-15 MG/5ML syrup Take 5 mLs by mouth 4 (four) times daily as needed. 118 mL 0   topiramate (TOPAMAX) 15 MG capsule Take 15 mg by mouth 2 (two) times daily.     No current facility-administered medications for this visit.    Musculoskeletal: Strength & Muscle Tone:  within normal limits Gait & Station: normal Patient leans: N/A  Psychiatric Specialty Exam: Review of Systems  There were no vitals taken for this visit.There is no height or weight on file to calculate BMI.  General Appearance: {Appearance:22683}  Eye Contact:  {BHH EYE CONTACT:22684}  Speech:  Clear and Coherent  Volume:  Normal  Mood:  {BHH MOOD:22306}  Affect:  {Affect (PAA):22687}  Thought Process:  Coherent  Orientation:  Full (Time, Place, and Person)  Thought Content:  Logical  Suicidal Thoughts:  {ST/HT (PAA):22692}  Homicidal Thoughts:  {ST/HT (PAA):22692}  Memory:  Immediate;   Good  Judgement:  {Judgement (PAA):22694}  Insight:  {Insight (PAA):22695}  Psychomotor Activity:  Normal  Concentration:  Concentration: Good and Attention Span: Good  Recall:  Good  Fund of Knowledge:Good  Language: Good  Akathisia:  No  Handed:  Right  AIMS (if indicated):  not done  Assets:  Communication Skills Desire for Improvement  ADL's:  Intact  Cognition: WNL  Sleep:  {BHH GOOD/FAIR/POOR:22877}   Screenings: Flowsheet Row UC from 11/10/2023 in [facility]   C-SSRS RISK CATEGORY Error: Question 6 not populated    Assessment and Plan:   Plan   The patient demonstrates the following risk factors for suicide: Chronic risk factors for suicide include: {Chronic Risk Factors for Dlprpiz:69585988}. Acute risk factors for suicide include: {Acute Risk Factors for Dlprpiz:69585987}. Protective factors for this patient include: {Protective Factors for Suicide Mpdx:69585986}. Considering these factors, the overall suicide risk at this point appears to be {Desc; low/moderate/high:110033}. Patient {ACTION; IS/IS WNU:78978602} appropriate for outpatient follow up.   Collaboration of Care: {BH OP Collaboration of Care:21014065}  Patient/Guardian was advised Release of Information must be obtained prior to any record release in order to collaborate their care with an  outside provider. Patient/Guardian was advised if they have not already done so to contact the registration department to sign all necessary forms in order for us  to release information regarding their care.   Consent: Patient/Guardian gives verbal consent for treatment and assignment of benefits for services provided during this visit. Patient/Guardian expressed understanding and agreed to proceed.   Katheren Sleet, MD 11/3/20258:36 AM

## 2023-11-27 ENCOUNTER — Ambulatory Visit: Admitting: Psychiatry

## 2023-12-25 ENCOUNTER — Other Ambulatory Visit: Payer: Self-pay

## 2023-12-25 ENCOUNTER — Encounter: Payer: Self-pay | Admitting: Psychiatry

## 2023-12-25 ENCOUNTER — Ambulatory Visit: Admitting: Psychiatry

## 2023-12-25 VITALS — BP 133/86 | HR 98 | Temp 98.3°F | Ht 67.0 in | Wt 130.8 lb

## 2023-12-25 DIAGNOSIS — R41840 Attention and concentration deficit: Secondary | ICD-10-CM | POA: Insufficient documentation

## 2023-12-25 DIAGNOSIS — F411 Generalized anxiety disorder: Secondary | ICD-10-CM | POA: Diagnosis not present

## 2023-12-25 DIAGNOSIS — F4010 Social phobia, unspecified: Secondary | ICD-10-CM | POA: Insufficient documentation

## 2023-12-25 DIAGNOSIS — F330 Major depressive disorder, recurrent, mild: Secondary | ICD-10-CM | POA: Insufficient documentation

## 2023-12-25 DIAGNOSIS — Z79899 Other long term (current) drug therapy: Secondary | ICD-10-CM | POA: Insufficient documentation

## 2023-12-25 MED ORDER — PROPRANOLOL HCL 10 MG PO TABS: 10.0000 mg | ORAL_TABLET | Freq: Two times a day (BID) | ORAL | 1 refills | Status: AC | PRN

## 2023-12-25 MED ORDER — VENLAFAXINE HCL ER 37.5 MG PO CP24: 37.5000 mg | ORAL_CAPSULE | Freq: Every day | ORAL | 1 refills | Status: DC

## 2023-12-25 NOTE — Patient Instructions (Signed)
 Venlafaxine  Tablets What is this medication? VENLAFAXINE  (VEN la fax een) treats depression and anxiety. It increases the amount of serotonin and norepinephrine in the brain, hormones that help regulate mood. It belongs to a group of medications called SNRIs. This medicine may be used for other purposes; ask your health care provider or pharmacist if you have questions. COMMON BRAND NAME(S): Effexor  What should I tell my care team before I take this medication? They need to know if you have any of these conditions: Bleeding disorders Glaucoma Heart disease High blood pressure High cholesterol Kidney disease Liver disease Low levels of sodium in the blood Mania or bipolar disorder Seizures Suicidal thoughts, plans, or attempt by you or a family member Take medications that treat or prevent blood clots Thyroid disease An unusual or allergic reaction to venlafaxine , other medications, foods, dyes, or preservatives Pregnant or trying to get pregnant Breastfeeding How should I use this medication? Take this medication by mouth with a glass of water. Take it as directed on the prescription label. Take it with food. Take your medication at regular intervals. Do not take your medication more often than directed. Keep taking this medication unless your care team tells you to stop. Stopping it too quickly can cause serious side effects. It can also make your condition worse. A special MedGuide will be given to you by the pharmacist with each prescription and refill. Be sure to read this information carefully each time. Talk to your care team about the use of this medication in children. Special care may be needed. Overdosage: If you think you have taken too much of this medicine contact a poison control center or emergency room at once. NOTE: This medicine is only for you. Do not share this medicine with others. What if I miss a dose? If you miss a dose, take it as soon as you can. If it is  almost time for your next dose, take only that dose. Do not take double or extra doses. What may interact with this medication? Do not take this medication with any of the following: Certain medications for fungal infections, such as fluconazole, itraconazole, ketoconazole, posaconazole, voriconazole Cisapride Desvenlafaxine Dronedarone Duloxetine Levomilnacipran Linezolid MAOIs, such as Carbex, Eldepryl, Marplan, Nardil, and Parnate Methylene blue (injected into a vein) Milnacipran Pimozide Thioridazine This medication may also interact with the following: Amphetamines Aspirin and aspirin-like medications Certain medications for mental health conditions Certain medications for migraine headaches, such as almotriptan, eletriptan, frovatriptan, naratriptan, rizatriptan, sumatriptan, zolmitriptan Certain medications for sleep Certain medications that treat or prevent blood clots, such as dalteparin, enoxaparin, warfarin Cimetidine Clozapine Diuretics Fentanyl Furazolidone Indinavir Isoniazid Lithium Metoprolol NSAIDS, medications for pain and inflammation, such as ibuprofen  or naproxen Other medications that cause heart rhythm changes Procarbazine Rasagiline Supplements, such as St. John's Wort, kava kava, valerian Tramadol Tryptophan This list may not describe all possible interactions. Give your health care provider a list of all the medicines, herbs, non-prescription drugs, or dietary supplements you use. Also tell them if you smoke, drink alcohol, or use illegal drugs. Some items may interact with your medicine. What should I watch for while using this medication? Tell your care team if your symptoms do not get better or if they get worse. Visit your care team for regular checks on your progress. Because it may take several weeks to see the full effects of this medication, it is important to continue your treatment as prescribed by your care team. Watch for new or worsening  thoughts of  suicide or depression. This includes sudden changes in mood, behaviors, or thoughts. These changes can happen at any time but are more common in the beginning of treatment or after a change in dose. Call your care team right away if you experience these thoughts or worsening depression. This medication may cause mood and behavior changes, such as anxiety, nervousness, irritability, hostility, restlessness, excitability, hyperactivity, or trouble sleeping. These changes can happen at any time but are more common in the beginning of treatment or after a change in dose. Call your care team right away if you notice any of these symptoms. This medication can cause an increase in blood pressure. Check with your care team for instructions on monitoring your blood pressure while taking this medication. This medication may affect your coordination, reaction time, or judgment. Do not drive or operate machinery until you know how this medication affects you. Sit up or stand slowly to reduce the risk of dizzy or fainting spells. Drinking alcohol with this medication can increase the risk of these side effects. Your mouth may get dry. Chewing sugarless gum or sucking hard candy and drinking plenty of water may help. Contact your care team if the problem does not go away or is severe. What side effects may I notice from receiving this medication? Side effects that you should report to your care team as soon as possible: Allergic reactions--skin rash, itching, hives, swelling of the face, lips, tongue, or throat Bleeding--bloody or black, tar-like stools, red or dark brown urine, vomiting blood or brown material that looks like coffee grounds, small, red or purple spots on skin, unusual bleeding or bruising Heart rhythm changes--fast or irregular heartbeat, dizziness, feeling faint or lightheaded, chest pain, trouble breathing Increase in blood pressure Loss of appetite with weight loss Low sodium  level--muscle weakness, fatigue, dizziness, headache, confusion Serotonin syndrome--irritability, confusion, fast or irregular heartbeat, muscle stiffness, twitching muscles, sweating, high fever, seizures, chills, vomiting, diarrhea Sudden eye pain or change in vision such as blurry vision, seeing halos around lights, vision loss Thoughts of suicide or self-harm, worsening mood, feelings of depression Side effects that usually do not require medical attention (report to your care team if they continue or are bothersome): Anxiety, nervousness Change in sex drive or performance Dizziness Dry mouth Excessive sweating Nausea Tremors or shaking Trouble sleeping This list may not describe all possible side effects. Call your doctor for medical advice about side effects. You may report side effects to FDA at 1-800-FDA-1088. Where should I keep my medication? Keep out of the reach of children and pets. Store at a controlled temperature between 20 and 25 degrees C (68 and 77 degrees F), in a dry place. Throw away any unused medication after the expiration date. NOTE: This sheet is a summary. It may not cover all possible information. If you have questions about this medicine, talk to your doctor, pharmacist, or health care provider.  2024 [employer] Standard (2022-01-03 00:00:00)Propranolol Tablets What is this medication? PROPRANOLOL (proe PRAN oh lole) treats many conditions such as high blood pressure, tremors, and a type of arrhythmia known as AFib (atrial fibrillation). It works by lowering your blood pressure and heart rate, making it easier for your heart to pump blood to the rest of your body. It may be used to prevent migraine headaches. It works by relaxing the blood vessels in the brain that cause migraines. It belongs to a group of medications called beta blockers. This medicine may be used for other purposes; ask your health  care provider or pharmacist if you have questions. COMMON  BRAND NAME(S): Inderal What should I tell my care team before I take this medication? They need to know if you have any of these conditions: Diabetes Having surgery Heart or blood vessel conditions, such as slow heartbeat, heart failure, heart block Kidney disease Liver disease Lung or breathing disease, such as asthma or COPD Myasthenia gravis Pheochromocytoma Thyroid disease An unusual or allergic reaction to propranolol, other medications, foods, dyes, or preservatives Pregnant or trying to get pregnant Breastfeeding How should I use this medication? Take this medication by mouth. Take it as directed on the prescription label at the same time every day. Keep taking it unless your care team tells you to stop. Talk to your care team about the use of this medication in children. Special care may be needed. Overdosage: If you think you have taken too much of this medicine contact a poison control center or emergency room at once. NOTE: This medicine is only for you. Do not share this medicine with others. What if I miss a dose? If you miss a dose, take it as soon as you can. If it is almost time for your next dose, take only that dose. Do not take double or extra doses. What may interact with this medication? Do not take this medication with any of the following: Thioridazine This medication may also interact with the following: Certain medications for blood pressure, heart disease, irregular heartbeat Epinephrine NSAIDs, medications for pain and inflammation, such as ibuprofen  or naproxen Warfarin Other medications may affect the way this medication works. Talk with your care team about all of the medications you take. They may suggest changes to your treatment plan to lower the risk of side effects and to make sure your medications work as intended. This list may not describe all possible interactions. Give your health care provider a list of all the medicines, herbs, non-prescription  drugs, or dietary supplements you use. Also tell them if you smoke, drink alcohol, or use illegal drugs. Some items may interact with your medicine. What should I watch for while using this medication? Visit your care team for regular checks on your progress. Check your blood pressure as directed. Know what your blood pressure should be and when to contact your care team. This medication may affect your coordination, reaction time, or judgment. Do not drive or operate machinery until you know how this medication affects you. Sit up or stand slowly to reduce the risk of dizzy or fainting spells. Drinking alcohol with this medication can increase the risk of these side effects. Do not suddenly stop taking this medication. This may increase your risk of side effects, such as chest pain and heart attack. If you no longer need to take this medication, your care team will lower the dose slowly over time to decrease the risk of side effects. If you are going to need surgery or a procedure, tell your care team that you are using this medication. This medication may affect blood glucose levels. It can also mask the symptoms of low blood sugar, such as a rapid heartbeat and tremors. If you have diabetes, it is important to check your blood sugar often while you are taking this medication. Do not treat yourself for coughs, colds, or pain while you are using this medication without asking your care team for advice. Some medications may increase your blood pressure. What side effects may I notice from receiving this medication? Side effects that  you should report to your care team as soon as possible: Allergic reactions--skin rash, itching, hives, swelling of the face, lips, tongue, or throat Heart failure--shortness of breath, swelling of the ankles, feet, or hands, sudden weight gain, unusual weakness or fatigue Low blood pressure--dizziness, feeling faint or lightheaded, blurry vision Raynaud's--cool, numb, or  painful fingers or toes that may change color from pale, to blue, to red Redness, blistering, peeling, or loosening of the skin, including inside the mouth Slow heartbeat--dizziness, feeling faint or lightheaded, confusion, trouble breathing, unusual weakness or fatigue Worsening mood, feelings of depression Side effects that usually do not require medical attention (report to your care team if they continue or are bothersome): Change in sex drive or performance Diarrhea Dizziness Fatigue Headache This list may not describe all possible side effects. Call your doctor for medical advice about side effects. You may report side effects to FDA at 1-800-FDA-1088. Where should I keep my medication? Keep out of the reach of children and pets. Store at room temperature between 20 and 25 degrees C (68 and 77 degrees F). Protect from light. Throw away any unused medication after the expiration date. NOTE: This sheet is a summary. It may not cover all possible information. If you have questions about this medicine, talk to your doctor, pharmacist, or health care provider.  2024 [employer] Standard (2022-01-07 00:00:00)
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
## 2024-01-25 ENCOUNTER — Other Ambulatory Visit
Admission: AD | Admit: 2024-01-25 | Discharge: 2024-01-25 | Disposition: A | Discharge status: Home / Self Care | Payer: Self-pay | Source: Ambulatory Visit | Attending: Psychiatry | Admitting: Psychiatry

## 2024-01-25 DIAGNOSIS — Z79899 Other long term (current) drug therapy: Secondary | ICD-10-CM | POA: Insufficient documentation

## 2024-01-25 LAB — TSH: TSH: 0.789 u[IU]/mL (ref 0.350–4.500)

## 2024-01-26 ENCOUNTER — Ambulatory Visit: Payer: Self-pay | Admitting: Psychiatry

## 2024-01-28 ENCOUNTER — Telehealth: Admitting: Psychiatry

## 2024-01-28 ENCOUNTER — Encounter: Payer: Self-pay | Admitting: Psychiatry

## 2024-01-28 DIAGNOSIS — F4010 Social phobia, unspecified: Secondary | ICD-10-CM

## 2024-01-28 DIAGNOSIS — F411 Generalized anxiety disorder: Secondary | ICD-10-CM

## 2024-01-28 DIAGNOSIS — F330 Major depressive disorder, recurrent, mild: Secondary | ICD-10-CM | POA: Diagnosis not present

## 2024-01-28 MED ORDER — VENLAFAXINE HCL ER 37.5 MG PO CP24: 37.5000 mg | ORAL_CAPSULE | Freq: Every day | ORAL | 1 refills | Status: AC

## 2024-01-28 NOTE — Progress Notes (Signed)
 Virtual Visit via Video Note  I connected with Garrett Gamble on 01/28/2024 at  1:30 PM EST by a video enabled telemedicine application and verified that I am speaking with the correct person using two identifiers.  Location Provider Location : [facility] Patient Location : Work  Participants: Patient , Provider    I discussed the limitations of evaluation and management by telemedicine and the availability of in person appointments. The patient expressed understanding and agreed to proceed.   I discussed the assessment and treatment plan with the patient. The patient was provided an opportunity to ask questions and all were answered. The patient agreed with the plan and demonstrated an understanding of the instructions.   The patient was advised to call back or seek an in-person evaluation if the symptoms worsen or if the condition fails to improve as anticipated.   BH MD OP Progress Note  01/28/2024 1:52 PM [name]  MRN:  [id number]  Chief Complaint:  Chief Complaint  Patient presents with   Medication Refill   Follow-up   Depression   Anxiety   Discussed the use of AI scribe software for clinical note transcription with the patient, who gave verbal consent to proceed.  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  He reports that his mood has been pretty good over the past couple of weeks. Describing a positive outlook, he states that he has not experienced any significant side effects from his current medications when taking them as prescribed. He notes missing 2 doses of venlafaxine  (Effexor ) due to a delay in receiving his medication by mail, which led to feeling sick and pretty horrible yesterday, but he resumed the medication this morning and feels better today.  He confirms that he is currently taking venlafaxine  and propranolol , with no changes to his medication regimen since the last visit.  He denies any side effects to medications otherwise.  He denies suicidal thoughts and denies thoughts of hurting others.  Denies any perceptual disturbances.  He has a history of attention and focus problems and was referred for ADHD testing, reports he did not hear back from them.   Patient is motivated to start psychotherapy sessions with in-house therapist which is coming up.     Visit Diagnosis:    ICD-10-CM   1. GAD (generalized anxiety disorder)  F41.1 venlafaxine  XR (EFFEXOR -XR) 37.5 MG 24 hr capsule    2. MDD (major depressive disorder), recurrent episode, mild  F33.0 venlafaxine  XR (EFFEXOR -XR) 37.5 MG 24 hr capsule    3. Social anxiety disorder  F40.10 venlafaxine  XR (EFFEXOR -XR) 37.5 MG 24 hr capsule      Past Psychiatric History: I have reviewed past psychiatric history from progress note on 12/25/2023.  Past trials of medications lamotrigine, alprazolam, diazepam, dextroamphetamine.  Past Medical History:  Past Medical History:  Diagnosis Date   HIV (human immunodeficiency virus infection) (HCC)     Past Surgical History:  Procedure Laterality Date   HEMORROIDECTOMY     TONSILLECTOMY      Family Psychiatric History: I have reviewed family psychiatric history from progress note on 12/25/2023.  Family History:  Family History  Problem Relation Age of Onset   Depression Mother    Anxiety disorder Mother     Social History: I have reviewed social history from progress note on  12/25/2023. Social History   Socioeconomic History   Marital status: Significant Other    Spouse name: Not on file   Number of children: 0   Years of education: Not on file   Highest education level: Some college, no degree  Occupational History   Not on file  Tobacco Use   Smoking status: Every Day    Types: Cigarettes   Smokeless tobacco: Never   Vaping Use   Vaping status: Never Used  Substance and Sexual Activity   Alcohol use: No   Drug use: Yes    Types: Marijuana   Sexual activity: Yes  Other Topics Concern   Not on file  Social History Narrative   Not on file   Social Drivers of Health   Tobacco Use: High Risk (01/28/2024)   Patient History    Smoking Tobacco Use: Every Day    Smokeless Tobacco Use: Never    Passive Exposure: Not on file  Financial Resource Strain: Not on file  Food Insecurity: Not on file  Transportation Needs: Not on file  Physical Activity: Not on file  Stress: Not on file  Social Connections: Not on file  Depression (PHQ2-9): High Risk (12/25/2023)   Depression (PHQ2-9)    PHQ-2 Score: 18  Alcohol Screen: Not on file  Housing: Not on file  Utilities: Not on file  Health Literacy: Not on file    Allergies: Allergies[1]  Metabolic Disorder Labs: No results found for: HGBA1C, MPG No results found for: PROLACTIN No results found for: CHOL, TRIG, HDL, CHOLHDL, VLDL, LDLCALC Lab Results  Component Value Date   TSH 0.789 01/25/2024    Therapeutic Level Labs: No results found for: LITHIUM No results found for: VALPROATE No results found for: CBMZ  Current Medications: Current Outpatient Medications  Medication Sig Dispense Refill   bictegravir-emtricitabine-tenofovir AF (BIKTARVY) 50-200-25 MG TABS tablet Take 1 tablet by mouth every evening.     gabapentin (NEURONTIN) 100 MG capsule Take 200 mg by mouth 3 (three) times daily as needed (anxiety).     ibuprofen  (ADVIL ) 600 MG tablet Take 1 tablet (600 mg total) by mouth every 6 (six) hours as needed. 30 tablet 0   propranolol  (INDERAL ) 10 MG tablet Take 1 tablet (10 mg total) by mouth 2 (two) times daily as needed. For anxiety attacks 60 tablet 1   topiramate (TOPAMAX) 15 MG capsule Take 15 mg by mouth 2 (two) times daily.     [START ON 02/25/2024] venlafaxine  XR (EFFEXOR -XR) 37.5 MG 24 hr capsule Take 1 capsule  (37.5 mg total) by mouth daily with breakfast. 30 capsule 1   No current facility-administered medications for this visit.     Musculoskeletal: Strength & Muscle Tone: UTA Gait & Station: Seated Patient leans: N/A  Psychiatric Specialty Exam: Review of Systems  Psychiatric/Behavioral:  Positive for decreased concentration and dysphoric mood. The patient is nervous/anxious.     There were no vitals taken for this visit.There is no height or weight on file to calculate BMI.  General Appearance: Casual  Eye Contact:  Fair  Speech:  Clear and Coherent  Volume:  Normal  Mood:  Anxious and Depressed improving  Affect:  Appropriate  Thought Process:  Goal Directed and Descriptions of Associations: Intact  Orientation:  Full (Time, Place, and Person)  Thought Content: Logical   Suicidal Thoughts:  No  Homicidal Thoughts:  No  Memory:  Immediate;   Fair Recent;   Fair Remote;   Fair  Judgement:  Fair  Insight:  Fair  Psychomotor Activity:  Normal  Concentration:  Concentration: Fair and Attention Span: Fair  Recall:  [employer] of Knowledge: Fair  Language: Fair  Akathisia:  No  Handed:  Right  AIMS (if indicated): not done  Assets:  Communication Skills Desire for Improvement Housing Social Support Transportation  ADL's:  Intact  Cognition: WNL  Sleep:  improving   Screenings: GAD-7    Flowsheet Row Office Visit from 12/25/2023 in [facility]  Total GAD-7 Score 21   PHQ2-9    Flowsheet Row Office Visit from 12/25/2023 in [facility]  PHQ-2 Total Score 3  PHQ-9 Total Score 18   Flowsheet Row Video Visit from 01/28/2024 in [facility] Office Visit from 12/25/2023 in [facility] UC from 11/10/2023 in [facility]   C-SSRS RISK CATEGORY No Risk No Risk Error: Question 6 not populated      Assessment and Plan: Soma Lizak is a 39 year old Caucasian male who presented for a follow-up appointment, discussed assessment and plan as noted below.  1. GAD (generalized anxiety disorder)-improving Currently reports good response to the venlafaxine . Continue Venlafaxine  extended release 37.5 mg daily Continue Propranolol  10 mg twice a day as needed  2. MDD (major depressive disorder), recurrent episode, mild-improving Reports improvement in depression symptoms Continue Venlafaxine  as prescribed Encouraged to follow-up with therapist for CBT, has upcoming appointment.  3. Social anxiety disorder-improving Reports overall improvement in anxiety symptoms although continues to have social anxiety. Will benefit from CBT Has upcoming appointment with in-house therapist Continue Propranolol  10 mg twice a day as needed  Follow-up Follow-up in clinic in 2 months or sooner if needed.    Collaboration of Care: Collaboration of Care: Other referred for ADHD testing last visit pending, patient provided phone number to give them a call to check on the status-Dr. Rodenbough.  Patient/Guardian was advised Release of Information must be obtained prior to any record release in order to collaborate their care with an outside provider. Patient/Guardian was advised if they have not already done so to contact the registration department to sign all necessary forms in order for us  to release information regarding their care.   Consent: Patient/Guardian gives verbal consent for treatment and assignment of benefits for services provided during this visit. Patient/Guardian expressed understanding and agreed to proceed.  This note was generated in part or whole with voice recognition software. Voice recognition is usually quite accurate but there are transcription errors that can and very often do occur. I apologize for any typographical errors that were not detected and corrected.     Ralphael Southgate, MD 01/30/2024, 8:20 AM     [1] No Known Allergies

## 2024-03-01 ENCOUNTER — Ambulatory Visit

## 2024-03-24 ENCOUNTER — Telehealth: Admitting: Psychiatry
